# Patient Record
Sex: Female | Born: 1960
Health system: Southern US, Community
[De-identification: ages and names within clinical notes are randomized; demographics above are authoritative.]

## PROBLEM LIST (undated history)

## (undated) DIAGNOSIS — E785 Hyperlipidemia, unspecified: Secondary | ICD-10-CM

## (undated) DIAGNOSIS — Z8781 Personal history of (healed) traumatic fracture: Secondary | ICD-10-CM

## (undated) HISTORY — PX: WRIST SURGERY: SHX841

## (undated) HISTORY — DX: Personal history of (healed) traumatic fracture: Z87.81

## (undated) HISTORY — DX: Hyperlipidemia, unspecified: E78.5

---

## 2000-01-19 ENCOUNTER — Other Ambulatory Visit: Admission: RE | Admit: 2000-01-19 | Discharge: 2000-01-19 | Payer: Self-pay | Admitting: *Deleted

## 2000-01-20 ENCOUNTER — Other Ambulatory Visit: Admission: RE | Admit: 2000-01-20 | Discharge: 2000-01-20 | Payer: Self-pay | Admitting: *Deleted

## 2000-08-05 ENCOUNTER — Emergency Department (HOSPITAL_COMMUNITY): Admission: EM | Admit: 2000-08-05 | Discharge: 2000-08-05 | Payer: Self-pay | Admitting: Emergency Medicine

## 2000-08-13 ENCOUNTER — Ambulatory Visit (HOSPITAL_COMMUNITY): Admission: RE | Admit: 2000-08-13 | Discharge: 2000-08-13 | Payer: Self-pay | Admitting: *Deleted

## 2001-03-29 ENCOUNTER — Other Ambulatory Visit: Admission: RE | Admit: 2001-03-29 | Discharge: 2001-03-29 | Payer: Self-pay | Admitting: *Deleted

## 2002-05-08 ENCOUNTER — Other Ambulatory Visit: Admission: RE | Admit: 2002-05-08 | Discharge: 2002-05-08 | Payer: Self-pay | Admitting: *Deleted

## 2003-06-30 ENCOUNTER — Other Ambulatory Visit: Admission: RE | Admit: 2003-06-30 | Discharge: 2003-06-30 | Payer: Self-pay | Admitting: *Deleted

## 2016-10-16 HISTORY — PX: WRIST SURGERY: SHX841

## 2018-03-28 ENCOUNTER — Encounter: Payer: Self-pay | Admitting: Family Medicine

## 2018-03-28 ENCOUNTER — Ambulatory Visit (INDEPENDENT_AMBULATORY_CARE_PROVIDER_SITE_OTHER): Payer: BLUE CROSS/BLUE SHIELD | Admitting: Family Medicine

## 2018-03-28 VITALS — BP 120/74 | HR 60 | Ht 61.25 in | Wt 163.8 lb

## 2018-03-28 DIAGNOSIS — W57XXXA Bitten or stung by nonvenomous insect and other nonvenomous arthropods, initial encounter: Secondary | ICD-10-CM

## 2018-03-28 DIAGNOSIS — Z7689 Persons encountering health services in other specified circumstances: Secondary | ICD-10-CM | POA: Diagnosis not present

## 2018-03-28 DIAGNOSIS — S30861A Insect bite (nonvenomous) of abdominal wall, initial encounter: Secondary | ICD-10-CM

## 2018-03-28 DIAGNOSIS — S80861A Insect bite (nonvenomous), right lower leg, initial encounter: Secondary | ICD-10-CM | POA: Diagnosis not present

## 2018-03-28 NOTE — Progress Notes (Signed)
Subjective:    Patient ID: Renee Morton, female    DOB: May 17, 1961, 57 y.o.   MRN: [id number]  HPI Chief Complaint  Patient presents with  . New Patient (Initial Visit)  . tick bite    waist line    She is new to the practice and here to establish care. No PCP in years   Reports being bitten by several insects including ticks and mosquitoes over the past month or so. States she saw a tick on right waistline and the tick was not engorged.   States on May 8-she had fever, chills, body aches, flu like symptoms and felt better the next morning. Has felt fine since then. No fever, chills, headache, rash, neck pain, vision changes, chest pain, shortness of breath, abdominal pain, N/V/D.   May 12- felt a scab at her waistline and pulled it off.  She is not sure if it was a tick or not.  States a couple of days later she had what she felt like was a bullseye rash around the area of the bite.   She then had what she believes was a mosquito bite on her right upper inner thigh and now has a scab and bruise around the area.   States she went to [facility] in [place] on May 17th and was prescribed Doxycycline and took a 10-day course of this.  States she also had blood work done and was told that it was negative.  She does not have the results with her.   She brought in what she thinks is a tick that she pulled off for me to see. Looking at it under a magnifying glass reveals what appears to be a piece of fuzz and not an insect.   LMP: 10 years ago.   Work in Airline pilot at [employer].   Overdue on mammogram, pap smear, colonoscopy etc.   Reviewed allergies, medications, past medical, surgical, family, and social history.    Review of Systems Pertinent positives and negatives in the history of present illness.     Objective:   Physical Exam  Constitutional: She is oriented to person, place, and time. She appears well-developed and well-nourished. No distress.  Neck: Normal range  of motion. Neck supple.  Musculoskeletal: Normal range of motion.  Neurological: She is alert and oriented to person, place, and time.  Skin: Skin is warm and dry. Capillary refill takes less than 2 seconds. No rash noted. No pallor.  Small purplish round bruise on her right medial upper leg, no edema, non tender, no sign of infection.   Right lower abdomen with a insect bite that appears to be healing, no sign of infection.   Dry patch of skin with a raised pink center to her left waistline. No induration or fluctuance. No sign of infection.   Psychiatric: She has a normal mood and affect. Her behavior is normal. Thought content normal.   BP 120/74   Pulse 60   Ht 5' 1.25" (1.556 m)   Wt 163 lb 12.8 oz (74.3 kg)   SpO2 98%   BMI 30.70 kg/m       Assessment & Plan:  Tick bite of abdomen, initial encounter  Insect bite of right lower extremity, initial encounter  Encounter to establish care  Discussed that she has no signs or symptoms of a tick borne illness and reassured her that she was also treated with a 10 day course of Doxycycline and had negative labs for tick illness  at the urgent care (per patient).  Encouraged her to let me know if she develops any new or worsening symptoms but for now she is well appearing and no further testing is needed.  Encouraged her to return for a CPE and to get updated on preventive healthcare. She will consider.

## 2019-12-03 ENCOUNTER — Ambulatory Visit (INDEPENDENT_AMBULATORY_CARE_PROVIDER_SITE_OTHER): Payer: Managed Care, Other (non HMO) | Admitting: Family Medicine

## 2019-12-03 ENCOUNTER — Other Ambulatory Visit: Payer: Self-pay

## 2019-12-03 ENCOUNTER — Encounter: Payer: Self-pay | Admitting: Family Medicine

## 2019-12-03 VITALS — BP 118/70 | HR 60 | Temp 96.9°F | Wt 169.8 lb

## 2019-12-03 DIAGNOSIS — Z0000 Encounter for general adult medical examination without abnormal findings: Secondary | ICD-10-CM

## 2019-12-03 DIAGNOSIS — Z1231 Encounter for screening mammogram for malignant neoplasm of breast: Secondary | ICD-10-CM

## 2019-12-03 DIAGNOSIS — Z789 Other specified health status: Secondary | ICD-10-CM | POA: Diagnosis not present

## 2019-12-03 DIAGNOSIS — Z23 Encounter for immunization: Secondary | ICD-10-CM

## 2019-12-03 DIAGNOSIS — E669 Obesity, unspecified: Secondary | ICD-10-CM

## 2019-12-03 DIAGNOSIS — Z8781 Personal history of (healed) traumatic fracture: Secondary | ICD-10-CM | POA: Diagnosis not present

## 2019-12-03 DIAGNOSIS — Z1211 Encounter for screening for malignant neoplasm of colon: Secondary | ICD-10-CM

## 2019-12-03 DIAGNOSIS — E2839 Other primary ovarian failure: Secondary | ICD-10-CM | POA: Diagnosis not present

## 2019-12-03 DIAGNOSIS — Z8639 Personal history of other endocrine, nutritional and metabolic disease: Secondary | ICD-10-CM

## 2019-12-03 DIAGNOSIS — Z1329 Encounter for screening for other suspected endocrine disorder: Secondary | ICD-10-CM

## 2019-12-03 NOTE — Progress Notes (Signed)
Subjective:    Patient ID: Renee Morton, female    DOB: November 22, 1960, 59 y.o.   MRN: [id number]  HPI Chief Complaint  Patient presents with  . other    referral for bone scan , boken bones in the past , Tdap,, mammo, cologuard    She is fairly new to the practice and here for a complete physical exam. Previous medical care: does not get regular medical care  Last CPE: many years ago  Other providers: None  She would like to get a bone density. States she had a wrist fracture as an adult. Her sister was diagnosed with osteoporosis.  She is not currently taking any supplements. Does not drink calcium. Hx of low vitamin D   Complains of a knot in the arch of her left foot for the past several weeks. No pain. No injury.    Social history: Work in Airline pilot at [employer].  Denies smoking or  drug use  States she drinks 1/2 bottle of wine nightly  Diet: nothing in particular  Excerise: none  Immunizations: would like to update Tdap  Health maintenance:  Mammogram:never  Colonoscopy:never  Last Gynecological Exam: many years ago  Last Menstrual cycle: 13 years ago  Reviewed allergies, medications, past medical, surgical, family, and social history.     Review of Systems Pertinent positives and negatives in the history of present illness.     Objective:   Physical Exam BP 118/70 (BP Location: Left Arm, Patient Position: Sitting)   Pulse 60   Temp (!) 96.9 F (36.1 C)   Wt 169 lb 12.8 oz (77 kg)   SpO2 99%   BMI 31.82 kg/m   General Appearance:    Alert, cooperative, no distress, appears stated age  Head:    Normocephalic, without obvious abnormality, atraumatic  Eyes:    PERRL, conjunctiva/corneas clear, EOM's intact  Ears:    Normal TM's and external ear canals  Nose:   Mask in place   Throat:   Mask   Neck:   Supple, no lymphadenopathy;  thyroid:  no   enlargement/tenderness/nodules; no JVD  Back:    Spine nontender, no curvature, ROM normal, no  CVA     tenderness  Lungs:     Clear to auscultation bilaterally without wheezes, rales or     ronchi; respirations unlabored  Chest Wall:    No tenderness or deformity   Heart:    Regular rate and rhythm, S1 and S2 normal, no murmur, rub   or gallop  Breast Exam:    Declines   Abdomen:     Soft, non-tender, nondistended, normoactive bowel sounds,    no masses, no hepatosplenomegaly  Genitalia:    Declines      Extremities:   No clubbing, cyanosis or edema  Pulses:   2+ and symmetric all extremities  Skin:   Skin color, texture, turgor normal, no rashes. Round, firm non tender cyst in arch of left foot   Lymph nodes:   Cervical, supraclavicular, and axillary nodes normal  Neurologic:   CNII-XII intact, normal strength, sensation and gait          Psych:   Normal mood, affect, hygiene and grooming.         Assessment & Plan:  Routine general medical examination at a health care facility - Plan: CBC with Differential/Platelet, Comprehensive metabolic panel, TSH, T4, free, T3, Lipid panel -She presented today requesting bone density study, Tdap and mammogram and is way overdue  for a CPE.  We did turn this into a CPE and discussed preventive healthcare including cancer screenings.  She will call and schedule mammogram and bone density at the breast center.  She refuses colonoscopy so I will order a Cologuard.  She refuses Pap smear.  Immunizations reviewed.  Discussed safety and health promotion.  Discussed healthy lifestyle including diet and exercise  Estrogen deficiency - Plan: DG Bone Density -Follow-up pending results.  Counseling on adequate calcium intake, vitamin D and weightbearing exercise  History of wrist fracture - Plan: DG Bone Density  Daily consumption of alcohol -Encouraged her to reduce her alcohol intake due to long-term health consequences  History of vitamin D deficiency - Plan: VITAMIN D 25 Hydroxy (Vit-D Deficiency, Fractures) -Check vitamin D level and replace as  appropriate  Screen for colon cancer - Plan: Cologuard -She declines colonoscopy.  Cologuard ordered and this will be her first colon cancer screening test  Encounter for screening mammogram for malignant neoplasm of breast - Plan: MM Digital Diagnostic Bilateral -She will call and schedule this at the breast center along with her DEXA  Obesity (BMI 30.0-34.9) - Plan: TSH, T4, free, T3, Lipid panel -Counseling on healthy diet and exercise  Need for diphtheria-tetanus-pertussis (Tdap) vaccine - Plan: Tdap vaccine greater than or equal to 7yo IM -Counseling on all components of the vaccine  Screening for thyroid disorder - Plan: TSH, T4, free, T3

## 2019-12-03 NOTE — Patient Instructions (Signed)
Call and schedule your mammogram and bone density at the breast center. You should receive the Cologuard for colon cancer screening in the mail with instructions on how to return it.  Return for your Pap smear at your convenience.  I recommend cutting back on the amount of wine you are drinking.   Try to eat a well-balanced diet with fresh fruits and vegetables and cut back on fatty foods and carbohydrates such as potatoes, rice, pasta, bread. It is recommended that you get at least 150 minutes of physical activity each week.  We will be in touch with your lab results     Preventive Care 58-78 Years Old, Female Preventive care refers to visits with your health care provider and lifestyle choices that can promote health and wellness. This includes:  A yearly physical exam. This may also be called an annual well check.  Regular dental visits and eye exams.  Immunizations.  Screening for certain conditions.  Healthy lifestyle choices, such as eating a healthy diet, getting regular exercise, not using drugs or products that contain nicotine and tobacco, and limiting alcohol use. What can I expect for my preventive care visit? Physical exam Your health care provider will check your:  Height and weight. This may be used to calculate body mass index (BMI), which tells if you are at a healthy weight.  Heart rate and blood pressure.  Skin for abnormal spots. Counseling Your health care provider may ask you questions about your:  Alcohol, tobacco, and drug use.  Emotional well-being.  Home and relationship well-being.  Sexual activity.  Eating habits.  Work and work Statistician.  Method of birth control.  Menstrual cycle.  Pregnancy history. What immunizations do I need?  Influenza (flu) vaccine  This is recommended every year. Tetanus, diphtheria, and pertussis (Tdap) vaccine  You may need a Td booster every 10 years. Varicella (chickenpox) vaccine  You may  need this if you have not been vaccinated. Zoster (shingles) vaccine  You may need this after age 28. Measles, mumps, and rubella (MMR) vaccine  You may need at least one dose of MMR if you were born in 1957 or later. You may also need a second dose. Pneumococcal conjugate (PCV13) vaccine  You may need this if you have certain conditions and were not previously vaccinated. Pneumococcal polysaccharide (PPSV23) vaccine  You may need one or two doses if you smoke cigarettes or if you have certain conditions. Meningococcal conjugate (MenACWY) vaccine  You may need this if you have certain conditions. Hepatitis A vaccine  You may need this if you have certain conditions or if you travel or work in places where you may be exposed to hepatitis A. Hepatitis B vaccine  You may need this if you have certain conditions or if you travel or work in places where you may be exposed to hepatitis B. Haemophilus influenzae type b (Hib) vaccine  You may need this if you have certain conditions. Human papillomavirus (HPV) vaccine  If recommended by your health care provider, you may need three doses over 6 months. You may receive vaccines as individual doses or as more than one vaccine together in one shot (combination vaccines). Talk with your health care provider about the risks and benefits of combination vaccines. What tests do I need? Blood tests  Lipid and cholesterol levels. These may be checked every 5 years, or more frequently if you are over 51 years old.  Hepatitis C test.  Hepatitis B test. Screening  Lung  cancer screening. You may have this screening every year starting at age 92 if you have a 30-pack-year history of smoking and currently smoke or have quit within the past 15 years.  Colorectal cancer screening. All adults should have this screening starting at age 20 and continuing until age 50. Your health care provider may recommend screening at age 40 if you are at increased  risk. You will have tests every 1-10 years, depending on your results and the type of screening test.  Diabetes screening. This is done by checking your blood sugar (glucose) after you have not eaten for a while (fasting). You may have this done every 1-3 years.  Mammogram. This may be done every 1-2 years. Talk with your health care provider about when you should start having regular mammograms. This may depend on whether you have a family history of breast cancer.  BRCA-related cancer screening. This may be done if you have a family history of breast, ovarian, tubal, or peritoneal cancers.  Pelvic exam and Pap test. This may be done every 3 years starting at age 92. Starting at age 52, this may be done every 5 years if you have a Pap test in combination with an HPV test. Other tests  Sexually transmitted disease (STD) testing.  Bone density scan. This is done to screen for osteoporosis. You may have this scan if you are at high risk for osteoporosis. Follow these instructions at home: Eating and drinking  Eat a diet that includes fresh fruits and vegetables, whole grains, lean protein, and low-fat dairy.  Take vitamin and mineral supplements as recommended by your health care provider.  Do not drink alcohol if: ? Your health care provider tells you not to drink. ? You are pregnant, may be pregnant, or are planning to become pregnant.  If you drink alcohol: ? Limit how much you have to 0-1 drink a day. ? Be aware of how much alcohol is in your drink. In the [place] one drink equals one 12 oz bottle of beer (355 mL), one 5 oz glass of wine (148 mL), or one 1 oz glass of hard liquor (44 mL). Lifestyle  Take daily care of your teeth and gums.  Stay active. Exercise for at least 30 minutes on 5 or more days each week.  Do not use any products that contain nicotine or tobacco, such as cigarettes, e-cigarettes, and chewing tobacco. If you need help quitting, ask your health care provider.   If you are sexually active, practice safe sex. Use a condom or other form of birth control (contraception) in order to prevent pregnancy and STIs (sexually transmitted infections).  If told by your health care provider, take low-dose aspirin daily starting at age 51. What's next?  Visit your health care provider once a year for a well check visit.  Ask your health care provider how often you should have your eyes and teeth checked.  Stay up to date on all vaccines. This information is not intended to replace advice given to you by your health care provider. Make sure you discuss any questions you have with your health care provider. Document Revised: 06/13/2018 Document Reviewed: 06/13/2018 Elsevier Patient Education  2020 [employer].

## 2019-12-04 ENCOUNTER — Other Ambulatory Visit: Payer: Self-pay | Admitting: Family Medicine

## 2019-12-04 ENCOUNTER — Encounter: Payer: Self-pay | Admitting: Family Medicine

## 2019-12-04 DIAGNOSIS — E559 Vitamin D deficiency, unspecified: Secondary | ICD-10-CM

## 2019-12-04 HISTORY — DX: Vitamin D deficiency, unspecified: E55.9

## 2019-12-04 LAB — CBC WITH DIFFERENTIAL/PLATELET
Basophils Absolute: 0 10*3/uL (ref 0.0–0.2)
Basos: 0 %
EOS (ABSOLUTE): 0.1 10*3/uL (ref 0.0–0.4)
Eos: 1 %
Hematocrit: 42.3 % (ref 34.0–46.6)
Hemoglobin: 14.6 g/dL (ref 11.1–15.9)
Immature Grans (Abs): 0 10*3/uL (ref 0.0–0.1)
Immature Granulocytes: 0 %
Lymphocytes Absolute: 2.6 10*3/uL (ref 0.7–3.1)
Lymphs: 40 %
MCH: 31.1 pg (ref 26.6–33.0)
MCHC: 34.5 g/dL (ref 31.5–35.7)
MCV: 90 fL (ref 79–97)
Monocytes Absolute: 0.5 10*3/uL (ref 0.1–0.9)
Monocytes: 8 %
Neutrophils Absolute: 3.2 10*3/uL (ref 1.4–7.0)
Neutrophils: 51 %
Platelets: 305 10*3/uL (ref 150–450)
RBC: 4.7 x10E6/uL (ref 3.77–5.28)
RDW: 13 % (ref 11.7–15.4)
WBC: 6.3 10*3/uL (ref 3.4–10.8)

## 2019-12-04 LAB — COMPREHENSIVE METABOLIC PANEL
ALT: 14 IU/L (ref 0–32)
AST: 17 IU/L (ref 0–40)
Albumin/Globulin Ratio: 1.5 (ref 1.2–2.2)
Albumin: 4.7 g/dL (ref 3.8–4.9)
Alkaline Phosphatase: 108 IU/L (ref 39–117)
BUN/Creatinine Ratio: 24 (ref 12–28)
BUN: 16 mg/dL (ref 8–27)
Bilirubin Total: 0.6 mg/dL (ref 0.0–1.2)
CO2: 24 mmol/L (ref 20–29)
Calcium: 10 mg/dL (ref 8.7–10.3)
Chloride: 104 mmol/L (ref 96–106)
Creatinine, Ser: 0.68 mg/dL (ref 0.57–1.00)
GFR calc Af Amer: 110 mL/min/{1.73_m2} (ref >59)
GFR calc non Af Amer: 95 mL/min/{1.73_m2} (ref >59)
Globulin, Total: 3.2 g/dL (ref 1.5–4.5)
Glucose: 100 mg/dL — ABNORMAL HIGH (ref 65–99)
Potassium: 5.1 mmol/L (ref 3.5–5.2)
Sodium: 141 mmol/L (ref 134–144)
Total Protein: 7.9 g/dL (ref 6.0–8.5)

## 2019-12-04 LAB — LIPID PANEL
Chol/HDL Ratio: 2.3 ratio (ref 0.0–4.4)
Cholesterol, Total: 248 mg/dL — ABNORMAL HIGH (ref 100–199)
HDL: 107 mg/dL (ref >39)
LDL Chol Calc (NIH): 129 mg/dL — ABNORMAL HIGH (ref 0–99)
Triglycerides: 72 mg/dL (ref 0–149)
VLDL Cholesterol Cal: 12 mg/dL (ref 5–40)

## 2019-12-04 LAB — TSH: TSH: 1.75 u[IU]/mL (ref 0.450–4.500)

## 2019-12-04 LAB — T3: T3, Total: 96 ng/dL (ref 71–180)

## 2019-12-04 LAB — VITAMIN D 25 HYDROXY (VIT D DEFICIENCY, FRACTURES): Vit D, 25-Hydroxy: 18.9 ng/mL — ABNORMAL LOW (ref 30.0–100.0)

## 2019-12-04 LAB — T4, FREE: Free T4: 1.25 ng/dL (ref 0.82–1.77)

## 2019-12-04 MED ORDER — VITAMIN D (ERGOCALCIFEROL) 1.25 MG (50000 UNIT) PO CAPS: 50000.0000 [IU] | ORAL_CAPSULE | ORAL | 0 refills | Status: DC

## 2019-12-04 NOTE — Progress Notes (Signed)
Her labs are fine overall but vitamin D is low. I will send in a prescription for her to take once weekly for the next 3 months. We will need to recheck her vitamin D at that point and hopefully she can start on a once daily multi-vitamin with 1,000 to 2,000 IUs of vitamin D3 at that point.  Her LDL or bad cholesterol is elevated so cutting back on fried foods, fatty foods and increasing physical activity will help.

## 2019-12-09 ENCOUNTER — Other Ambulatory Visit: Payer: Self-pay

## 2019-12-09 ENCOUNTER — Ambulatory Visit
Admission: RE | Admit: 2019-12-09 | Discharge: 2019-12-09 | Disposition: A | Discharge status: Home / Self Care | Payer: Managed Care, Other (non HMO) | Source: Ambulatory Visit | Attending: Family Medicine | Admitting: Family Medicine

## 2019-12-09 ENCOUNTER — Encounter: Payer: Self-pay | Admitting: Family Medicine

## 2019-12-09 DIAGNOSIS — Z8781 Personal history of (healed) traumatic fracture: Secondary | ICD-10-CM

## 2019-12-09 DIAGNOSIS — E2839 Other primary ovarian failure: Secondary | ICD-10-CM

## 2019-12-09 DIAGNOSIS — M810 Age-related osteoporosis without current pathological fracture: Secondary | ICD-10-CM

## 2019-12-09 DIAGNOSIS — M8580 Other specified disorders of bone density and structure, unspecified site: Secondary | ICD-10-CM | POA: Insufficient documentation

## 2019-12-09 HISTORY — DX: Age-related osteoporosis without current pathological fracture: M81.0

## 2019-12-09 NOTE — Progress Notes (Signed)
Her bone density shows that she has osteoporosis. Please schedule her for a virtual or in office to discuss the diagnosis and treatment options.

## 2019-12-10 ENCOUNTER — Other Ambulatory Visit: Payer: Self-pay | Admitting: Internal Medicine

## 2019-12-10 ENCOUNTER — Encounter: Payer: Self-pay | Admitting: Family Medicine

## 2019-12-10 DIAGNOSIS — R937 Abnormal findings on diagnostic imaging of other parts of musculoskeletal system: Secondary | ICD-10-CM

## 2019-12-10 HISTORY — DX: Abnormal findings on diagnostic imaging of other parts of musculoskeletal system: R93.7

## 2019-12-10 NOTE — Progress Notes (Signed)
Per this addendum from radiology, she needs an XR of her right femur. Diagnosis is in the problem list which is abnormal bone density screening

## 2019-12-12 NOTE — Progress Notes (Signed)
Please call her and let her know that I strongly recommend that she get the XR of her right upper leg. Ask if she is planning on getting this?

## 2019-12-16 ENCOUNTER — Ambulatory Visit
Admission: RE | Admit: 2019-12-16 | Discharge: 2019-12-16 | Disposition: A | Discharge status: Home / Self Care | Payer: Managed Care, Other (non HMO) | Source: Ambulatory Visit | Attending: Family Medicine | Admitting: Family Medicine

## 2019-12-16 ENCOUNTER — Other Ambulatory Visit: Payer: Self-pay

## 2019-12-16 DIAGNOSIS — R937 Abnormal findings on diagnostic imaging of other parts of musculoskeletal system: Secondary | ICD-10-CM

## 2019-12-17 ENCOUNTER — Other Ambulatory Visit: Payer: Self-pay | Admitting: Family Medicine

## 2019-12-17 DIAGNOSIS — M810 Age-related osteoporosis without current pathological fracture: Secondary | ICD-10-CM

## 2019-12-17 DIAGNOSIS — M899 Disorder of bone, unspecified: Secondary | ICD-10-CM

## 2019-12-17 DIAGNOSIS — R937 Abnormal findings on diagnostic imaging of other parts of musculoskeletal system: Secondary | ICD-10-CM

## 2019-12-17 DIAGNOSIS — R936 Abnormal findings on diagnostic imaging of limbs: Secondary | ICD-10-CM

## 2019-12-17 NOTE — Progress Notes (Signed)
She needs an MRI of her right femur due to sclerotic lesion on X-ray. I called Renee Morton and told her the plan. Please let her know as soon as we have the MRI scheduled. Thanks.

## 2019-12-19 ENCOUNTER — Telehealth: Payer: Self-pay | Admitting: Internal Medicine

## 2019-12-19 LAB — COLOGUARD: Cologuard: NEGATIVE

## 2019-12-19 NOTE — Telephone Encounter (Signed)
Pt states that she got a bill for her bone density and it was not coded as preventative and I advised her that he it was coded as family hx, history of wrist fracture and estrogen def. She wants it to be recoded as preventative as that's why she wanted to get the bone density.

## 2019-12-21 NOTE — Telephone Encounter (Signed)
I will check into this Monday and get back to her.

## 2019-12-22 ENCOUNTER — Other Ambulatory Visit: Payer: Self-pay | Admitting: Internal Medicine

## 2019-12-22 DIAGNOSIS — M899 Disorder of bone, unspecified: Secondary | ICD-10-CM

## 2019-12-22 DIAGNOSIS — R936 Abnormal findings on diagnostic imaging of limbs: Secondary | ICD-10-CM

## 2019-12-22 NOTE — Telephone Encounter (Signed)
noted 

## 2019-12-22 NOTE — Progress Notes (Signed)
If she cannot get the MRI, another option would be to offer a referral to orthopedist for a second opinion but I am not sure they would recommend anything new. We can offer this option of course.

## 2019-12-23 LAB — COLOGUARD: COLOGUARD: NEGATIVE

## 2019-12-24 ENCOUNTER — Other Ambulatory Visit: Payer: Self-pay

## 2019-12-24 ENCOUNTER — Encounter: Payer: Self-pay | Admitting: Internal Medicine

## 2019-12-24 ENCOUNTER — Ambulatory Visit (INDEPENDENT_AMBULATORY_CARE_PROVIDER_SITE_OTHER): Payer: Managed Care, Other (non HMO) | Admitting: Orthopaedic Surgery

## 2019-12-24 ENCOUNTER — Encounter: Payer: Self-pay | Admitting: Orthopaedic Surgery

## 2019-12-24 DIAGNOSIS — M899 Disorder of bone, unspecified: Secondary | ICD-10-CM | POA: Diagnosis not present

## 2019-12-24 NOTE — Progress Notes (Signed)
Office Visit Note   Patient: Renee Morton           Date of Birth: 29-Jun-1961           MRN: [id number] Visit Date: 12/24/2019              Requested by: Girtha Rm, NP-C [place],  [place] [address] PCP: Girtha Rm, NP-C   Assessment & Plan: Visit Diagnoses:  1. Lesion of right femur     Plan: Based on the x-ray findings and the clinical exam findings I would hold off on a MRI for now.  The patient is completely asymptomatic.  I think this can be followed conservative with repeat x-rays of her right femur in 3 months from now.  Not have talked to her in length and if she does develop any night pain or worsening pain or pain with mobility she needs to let us know because we would certainly then recommend an MRI but I do feel that it is warranted to treat this is likely an incidental finding with something benign.  All question concerns were answered addressed.  Follow-Up Instructions: Return in about 3 months (around 03/25/2020).   Orders:  No orders of the defined types were placed in this encounter.  No orders of the defined types were placed in this encounter.     Procedures: No procedures performed   Clinical Data: No additional findings.   Subjective: No chief complaint on file. The patient is a very pleasant 59 year old female is seen here for second opinion as a relates to an incidental finding of questionable bony lesions in her right femur.  She has never injured her right lower extremity.  She has never had a diagnosis of any type of cancer.  She denies any pain at all with the right femur at the hip or the knee.  She has had bone density exams there is -2 suggesting osteoporosis.  Plain films were obtained of her femur which showed questionable lesions in the femur and so she is here for evaluation and treatment.  An MRI has been ordered but she want to hold off on that until she sees me.  Again she denies any pain at all.  She denies  any pain in other joints or bones either.  She is not a smoker not a diabetic.  She does drink wine on occasion.  HPI  Review of Systems She currently denies any headache, chest pain, shortness of breath, fever, chills, nausea, vomiting  Objective: Vital Signs: There were no vitals taken for this visit.  Physical Exam She is alert and orient x3 and in no acute distress Ortho Exam Examination of her right lower extremity is entirely normal.  She has full range of motion of her right hip and her right knee.  There is no joint effusion of the right knee.  I can compress and palpate throughout the entire femur and she is asymptomatic. Specialty Comments:  No specialty comments available.  Imaging: No results found. I did independently review x-rays of the right femur.  I can see the calcifications in the metaphyseal section of the distal femur as well as at the proximal femur in the metaphysis diaphysis area.  This appears benign to me.  There is no evidence of cortical destruction or lytic lesions.  There are no calcifications in the soft tissues.  There is no evidence of joint effusion at the knee.  PMFS History: Patient Active Problem List  Diagnosis Date Noted  . Abnormal bone density screening 12/10/2019  . Osteoporosis 12/09/2019  . Vitamin D deficiency 12/04/2019  . Daily consumption of alcohol 12/03/2019  . Estrogen deficiency 12/03/2019  . History of wrist fracture    Past Medical History:  Diagnosis Date  . Abnormal bone density screening 12/10/2019  . History of wrist fracture   . Osteoporosis 12/09/2019  . Vitamin D deficiency 12/04/2019    Family History  Problem Relation Age of Onset  . Hypertension Mother   . Prostate cancer Father     Past Surgical History:  Procedure Laterality Date  . WRIST SURGERY     Social History   Occupational History  . Not on file  Tobacco Use  . Smoking status: Never Smoker  . Smokeless tobacco: Never Used  Substance and  Sexual Activity  . Alcohol use: Yes  . Drug use: Never  . Sexual activity: Not Currently

## 2019-12-29 ENCOUNTER — Ambulatory Visit (HOSPITAL_COMMUNITY): Payer: Managed Care, Other (non HMO)

## 2020-01-02 ENCOUNTER — Other Ambulatory Visit: Payer: Self-pay

## 2020-01-02 ENCOUNTER — Ambulatory Visit
Admission: RE | Admit: 2020-01-02 | Discharge: 2020-01-02 | Disposition: A | Discharge status: Home / Self Care | Payer: Managed Care, Other (non HMO) | Source: Ambulatory Visit | Attending: Family Medicine | Admitting: Family Medicine

## 2020-01-02 DIAGNOSIS — Z1231 Encounter for screening mammogram for malignant neoplasm of breast: Secondary | ICD-10-CM

## 2020-03-25 ENCOUNTER — Ambulatory Visit: Payer: Managed Care, Other (non HMO) | Admitting: Orthopaedic Surgery

## 2020-03-30 ENCOUNTER — Telehealth: Payer: Self-pay

## 2020-03-30 NOTE — Telephone Encounter (Signed)
LVM for pt to call back and schedule a nurse visit for a vitamin D recheck. KH

## 2020-07-27 IMAGING — MG DIGITAL SCREENING BILAT W/ TOMO W/ CAD
6 of 10 series · 6 of 30 positions shown · non-contrast
Comparison: None.

ACR Breast Density Category a: The breast tissue is almost entirely
fatty.

CLINICAL DATA: Screening.

EXAM:
DIGITAL SCREENING BILATERAL MAMMOGRAM WITH TOMO AND CAD

[L CC synth-2D]
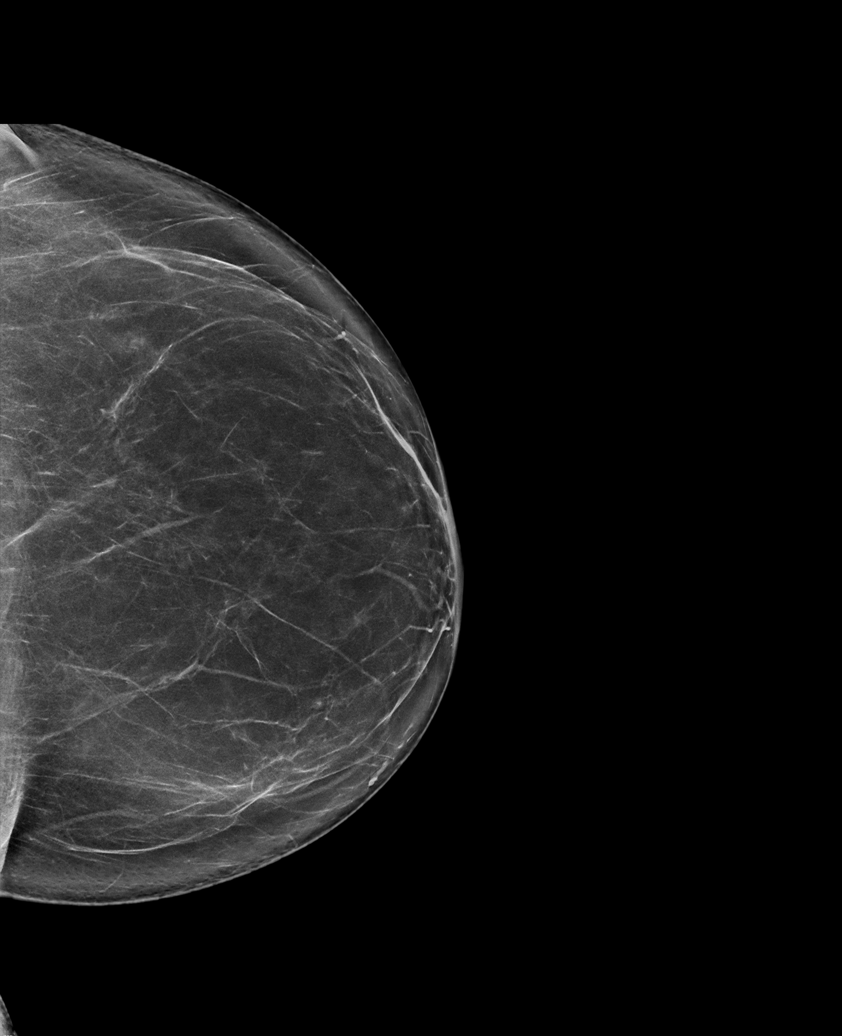

[L MLO synth-2D (1 of 2)]
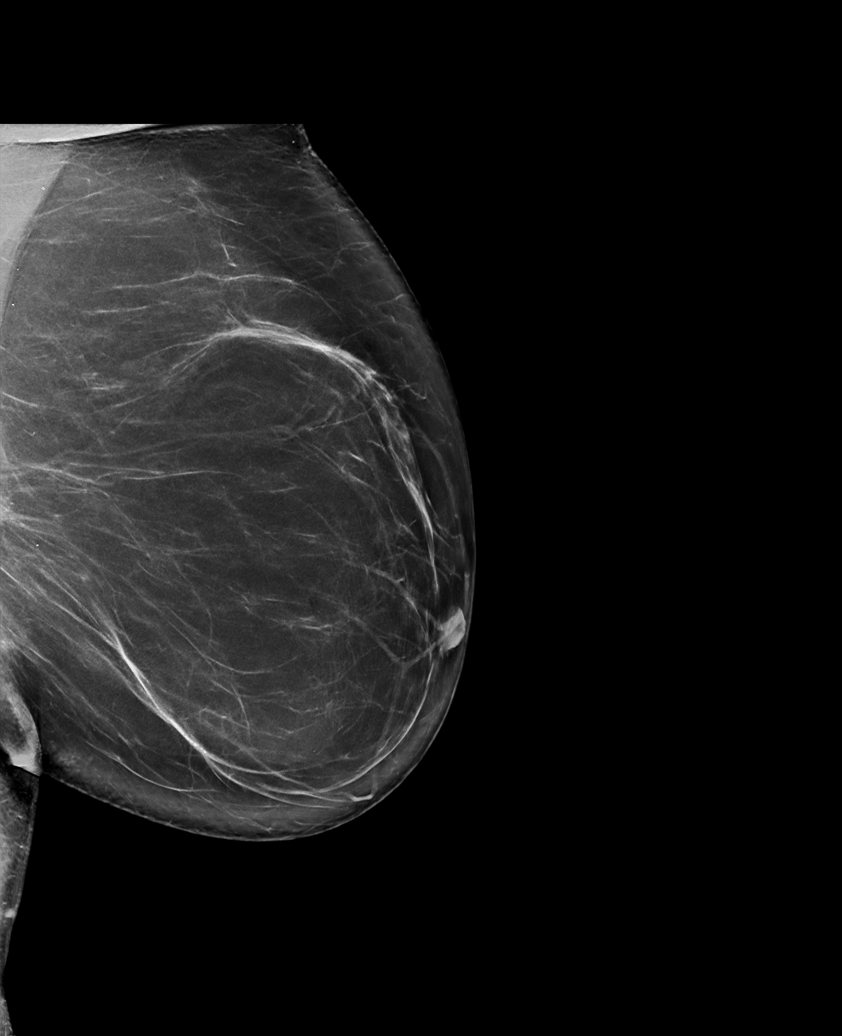

[R MLO synth-2D]
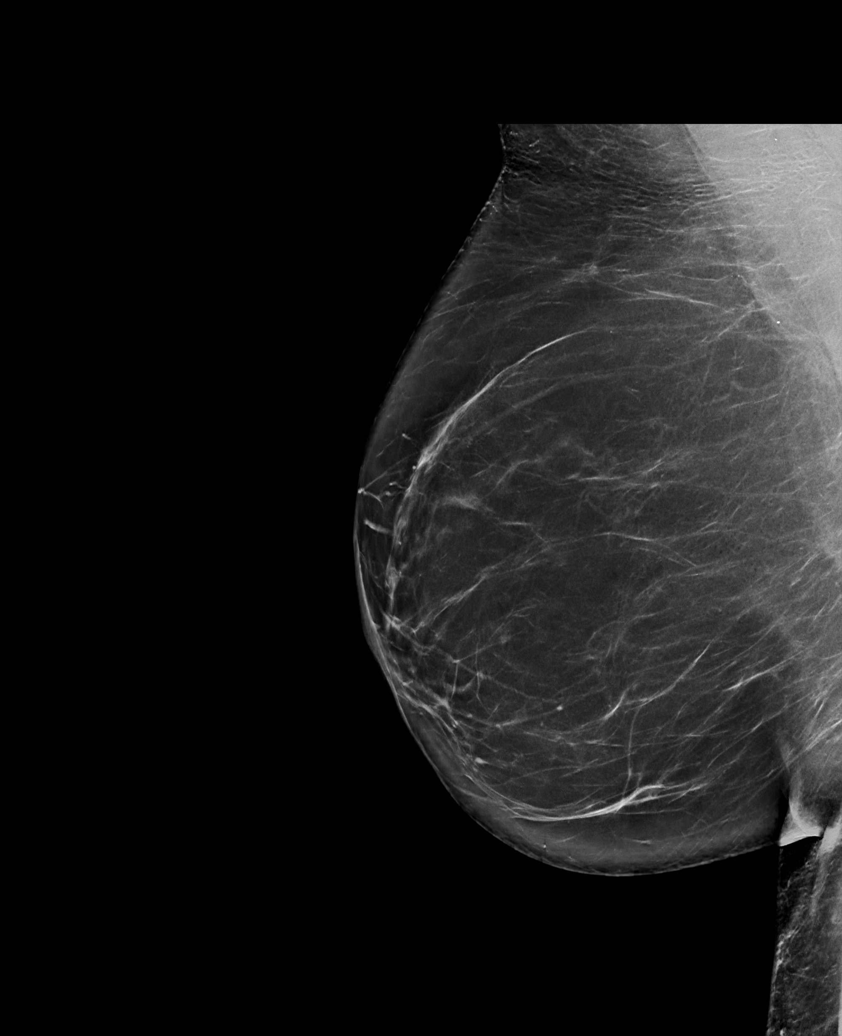

[R CC synth-2D]
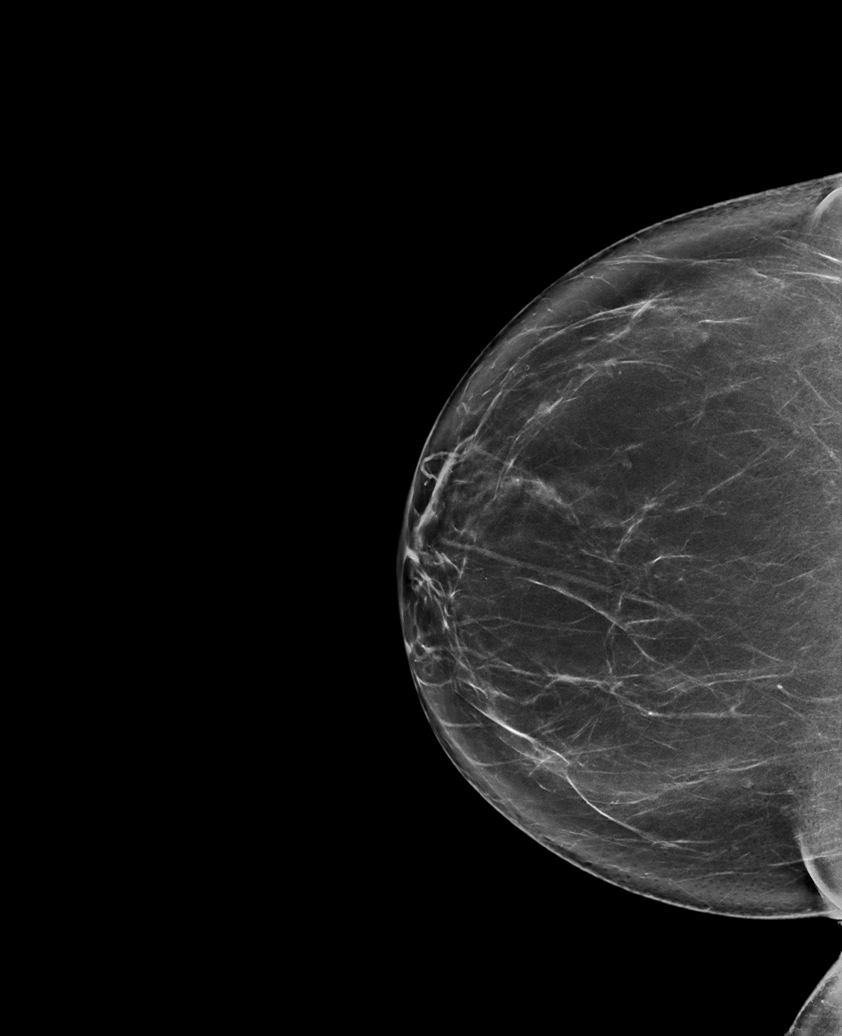

[L MLO synth-2D (2 of 2)]
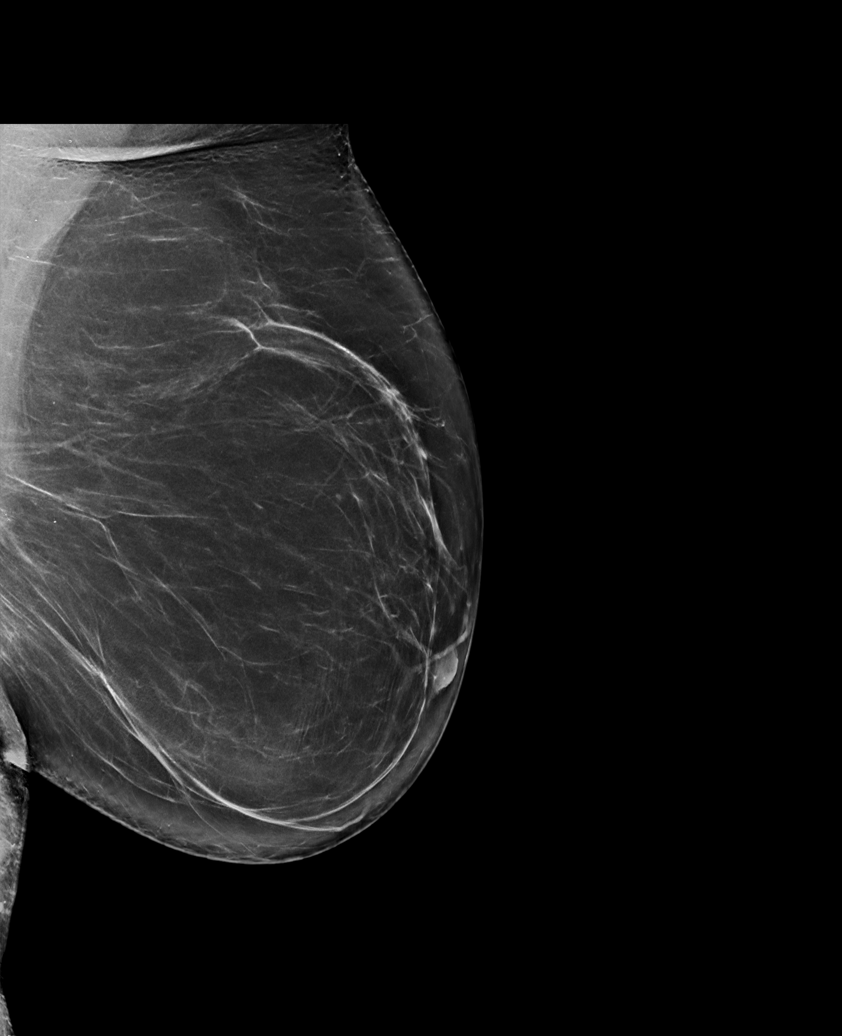

[L CC tomo · tomo slice 40/79.0]
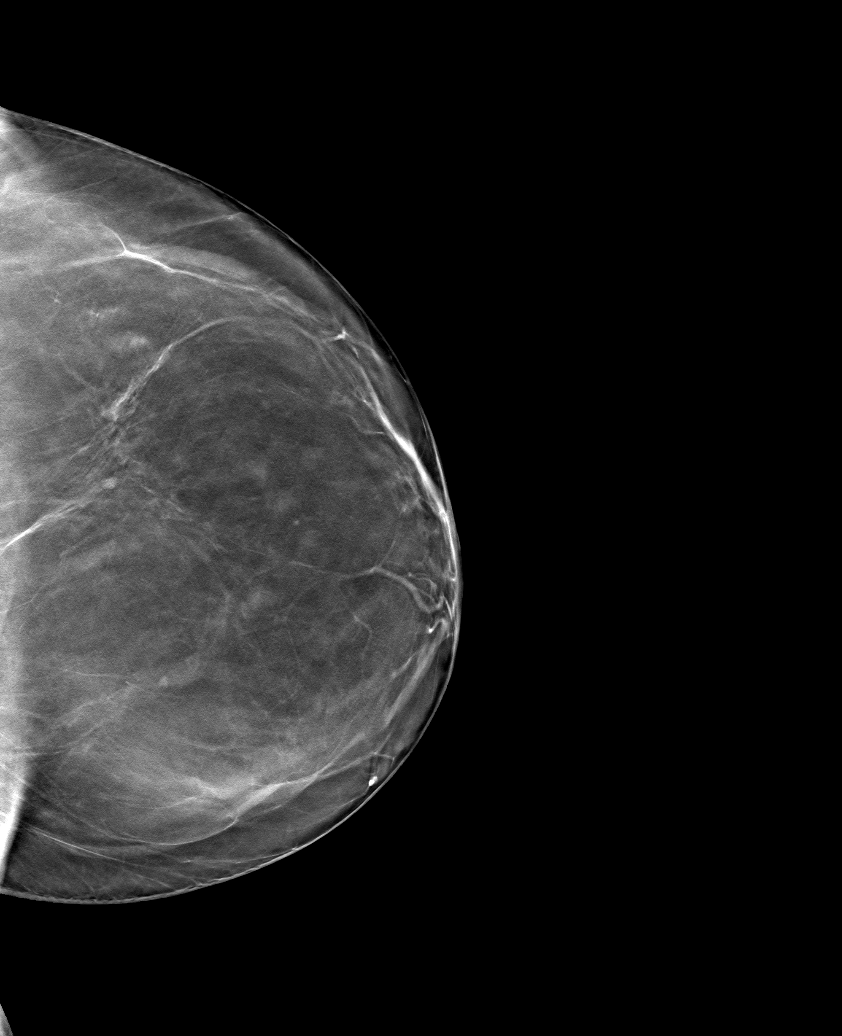

[6 of 30 positions shown; findings below may reference images not displayed]

FINDINGS: There are no findings suspicious for malignancy. Images were
processed with CAD.
IMPRESSION: No mammographic evidence of malignancy. A result letter of this
screening mammogram will be mailed directly to the patient.

RECOMMENDATION:
Screening mammogram in one year. (Code:[id number])

BI-RADS CATEGORY  1: Negative.

## 2021-01-25 ENCOUNTER — Encounter: Payer: Self-pay | Admitting: Internal Medicine

## 2022-08-30 ENCOUNTER — Encounter: Payer: Self-pay | Admitting: Internal Medicine

## 2024-04-21 ENCOUNTER — Ambulatory Visit: Payer: Self-pay

## 2024-04-21 NOTE — Telephone Encounter (Signed)
 FYI Only or Action Required?: FYI only for provider.  Patient was last seen in primary care on unknown. Called Nurse Triage reporting Knee Pain. Symptoms began several months ago. Interventions attempted: Nothing. Symptoms are: stable.  Triage Disposition: See PCP Within 2 Weeks  Patient/caregiver understands and will follow disposition?: Yes                             Copied from CRM [id number]. Topic: Clinical - Red Word Triage >> Apr 21, 2024 11:55 AM Renee Morton wrote: Red Word that prompted transfer to Nurse Triage: The patient called to establish care at [facility] with NP Renee Morton. However, she advised me that she has a lesion on her knee that is causing her pain and sensitivity when kneeling. Reason for Disposition  [1] MILD pain (e.g., does not interfere with normal activities) AND [2] present > 7 days  Answer Assessment - Initial Assessment Questions 1. LOCATION and RADIATION: Where is the pain located?      Right knee, pain is localized to right knee 3. SEVERITY: How bad is the pain? What does it keep you from doing?   (Scale 1-10; or mild, moderate, severe)   -  MILD (1-3): doesn't interfere with normal activities    -  MODERATE (4-7): interferes with normal activities (e.g., work or school) or awakens from sleep, limping    -  SEVERE (8-10): excruciating pain, unable to do any normal activities, unable to walk     Rates pain a 3, states she is able to walk normally  4. ONSET: When did the pain start? Does it come and go, or is it there all the time?     A few months 5. RECURRENT: Have you had this pain before? If Yes, ask: When, and what happened then?     Denies 7. AGGRAVATING FACTORS: What makes the knee pain worse? (e.g., walking, climbing stairs, running)     Kneeling or crawling 8. ASSOCIATED SYMPTOMS: Is there any swelling or redness of the knee?     Denies redness, denies swelling    States she had a  bone scan in 2021 and followed-up with orthopedics  Protocols used: Knee Pain-A-AH

## 2024-04-22 ENCOUNTER — Ambulatory Visit (HOSPITAL_BASED_OUTPATIENT_CLINIC_OR_DEPARTMENT_OTHER)
Admission: RE | Admit: 2024-04-22 | Discharge: 2024-04-22 | Disposition: A | Discharge status: Home / Self Care | Source: Ambulatory Visit | Attending: Family | Admitting: Family

## 2024-04-22 ENCOUNTER — Ambulatory Visit: Payer: Self-pay | Admitting: Family

## 2024-04-22 VITALS — BP 120/80 | HR 74 | Temp 98.1°F | Ht 62.0 in | Wt 166.0 lb

## 2024-04-22 DIAGNOSIS — M810 Age-related osteoporosis without current pathological fracture: Secondary | ICD-10-CM

## 2024-04-22 DIAGNOSIS — M25561 Pain in right knee: Secondary | ICD-10-CM | POA: Insufficient documentation

## 2024-04-22 DIAGNOSIS — R936 Abnormal findings on diagnostic imaging of limbs: Secondary | ICD-10-CM | POA: Insufficient documentation

## 2024-04-22 DIAGNOSIS — Z8349 Family history of other endocrine, nutritional and metabolic diseases: Secondary | ICD-10-CM | POA: Diagnosis not present

## 2024-04-22 DIAGNOSIS — Z1231 Encounter for screening mammogram for malignant neoplasm of breast: Secondary | ICD-10-CM | POA: Diagnosis not present

## 2024-04-22 DIAGNOSIS — L989 Disorder of the skin and subcutaneous tissue, unspecified: Secondary | ICD-10-CM

## 2024-04-22 DIAGNOSIS — Z1211 Encounter for screening for malignant neoplasm of colon: Secondary | ICD-10-CM

## 2024-04-22 DIAGNOSIS — E559 Vitamin D deficiency, unspecified: Secondary | ICD-10-CM

## 2024-04-22 DIAGNOSIS — Z789 Other specified health status: Secondary | ICD-10-CM

## 2024-04-22 DIAGNOSIS — E785 Hyperlipidemia, unspecified: Secondary | ICD-10-CM | POA: Diagnosis not present

## 2024-04-22 DIAGNOSIS — M898X8 Other specified disorders of bone, other site: Secondary | ICD-10-CM | POA: Diagnosis not present

## 2024-04-22 NOTE — Assessment & Plan Note (Signed)
 Update x-ray. If progressive changes, plan referral back to ortho.

## 2024-04-22 NOTE — Assessment & Plan Note (Signed)
 New.  Suspect osteoarthritis. Recommended tylenol or voltaren gel prn.  Obtain baseline x-ray.

## 2024-04-22 NOTE — Assessment & Plan Note (Signed)
 Not currently on supplement. Will update vit D level today.

## 2024-04-22 NOTE — Assessment & Plan Note (Signed)
 Will update dexa and vit D.  Encouraged pt to ensure 1200 mg of calcium daily either through diet or supplement as well as weight bearing exercise.

## 2024-04-22 NOTE — Patient Instructions (Signed)
 VISIT SUMMARY:  Today, you visited us  for your right knee pain and follow-up imaging of your femur. We discussed your history of osteoporosis and other health concerns, including a chronic lip lesion and general health maintenance.  YOUR PLAN:  KNEE PAIN: You have chronic right knee pain, likely due to arthritis. -We will order an x-ray of your right knee to confirm the diagnosis and rule out other causes. -If the x-ray findings are concerning, we may consider an MRI. -We discussed potential causes such as arthritis or bursitis.  FEMUR LESIONS: You have incidental lesions on your femur that need follow-up imaging to assess any changes. -We will order an x-ray of your femur and compare it with the previous x-ray from 2021. -If there are any changes, we may refer you to Dr. Vernetta.  OSTEOPOROSIS: You have bone thinning with a family history and previous fractures, suggesting osteoporosis. -We will order a bone density scan. -We will check your vitamin D  levels. -Consider taking calcium supplements if your dietary intake is < 1200 mg a day. -We will evaluate the need for bone-building medications based on your updated bone density results.  LIP LESION: You have a chronic chapped area on your lip that may be related to sun exposure. -We will refer you to Dr. Junior for a dermatological evaluation.  GENERAL HEALTH MAINTENANCE: Routine screenings and preventive measures are important for your overall health. -We will order a Cologuard test for colon cancer screening. -We will order a mammogram. -We will plan a Pap smear during your upcoming physical. -We will order a metabolic panel including glucose, kidney, and liver function tests. -We will check your cholesterol levels. -We will order a thyroid function test.
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
## 2024-04-22 NOTE — Assessment & Plan Note (Signed)
 Encouraged her to limit alcohol to 1 or less drinks/day.

## 2024-04-23 ENCOUNTER — Encounter: Payer: Self-pay | Admitting: Family

## 2024-04-23 ENCOUNTER — Ambulatory Visit: Payer: Self-pay | Admitting: Family

## 2024-04-23 ENCOUNTER — Telehealth: Payer: Self-pay | Admitting: Family

## 2024-04-23 DIAGNOSIS — E785 Hyperlipidemia, unspecified: Secondary | ICD-10-CM | POA: Insufficient documentation

## 2024-04-23 DIAGNOSIS — M25561 Pain in right knee: Secondary | ICD-10-CM

## 2024-04-23 DIAGNOSIS — E559 Vitamin D deficiency, unspecified: Secondary | ICD-10-CM

## 2024-04-23 LAB — COMPREHENSIVE METABOLIC PANEL WITH GFR
ALT: 14 U/L (ref 0–35)
AST: 19 U/L (ref 0–37)
Albumin: 4.4 g/dL (ref 3.5–5.2)
Alkaline Phosphatase: 75 U/L (ref 39–117)
BUN: 17 mg/dL (ref 6–23)
CO2: 29 meq/L (ref 19–32)
Calcium: 9.7 mg/dL (ref 8.4–10.5)
Chloride: 102 meq/L (ref 96–112)
Creatinine, Ser: 0.79 mg/dL (ref 0.40–1.20)
GFR: 78.91 mL/min (ref >60.00)
Glucose, Bld: 90 mg/dL (ref 70–99)
Potassium: 4.1 meq/L (ref 3.5–5.1)
Sodium: 140 meq/L (ref 135–145)
Total Bilirubin: 0.4 mg/dL (ref 0.2–1.2)
Total Protein: 7.4 g/dL (ref 6.0–8.3)

## 2024-04-23 LAB — LIPID PANEL
Cholesterol: 247 mg/dL — ABNORMAL HIGH (ref 0–200)
HDL: 72 mg/dL (ref >39.00)
LDL Cholesterol: 145 mg/dL — ABNORMAL HIGH (ref 0–99)
NonHDL: 174.63
Total CHOL/HDL Ratio: 3
Triglycerides: 150 mg/dL — ABNORMAL HIGH (ref 0.0–149.0)
VLDL: 30 mg/dL (ref 0.0–40.0)

## 2024-04-23 LAB — TSH: TSH: 1.24 u[IU]/mL (ref 0.35–5.50)

## 2024-04-23 LAB — VITAMIN D 25 HYDROXY (VIT D DEFICIENCY, FRACTURES): VITD: 24.82 ng/mL — ABNORMAL LOW (ref 30.00–100.00)

## 2024-04-23 MED ORDER — VITAMIN D (ERGOCALCIFEROL) 1.25 MG (50000 UNIT) PO CAPS: 50000.0000 [IU] | ORAL_CAPSULE | ORAL | 0 refills | Status: DC

## 2024-04-23 NOTE — Telephone Encounter (Signed)
 Vitamin D  level is low.  Advise patient to begin vit D 50000 units once weekly for 12 weeks, then plan follow up in 12 weeks for CPE and vit D follow up.  Cholesterol is elevated. Please continue work on low fat/low cholesterol diet/exercise and weight loss.

## 2024-04-24 NOTE — Telephone Encounter (Signed)
 Pt aware and appt scheduled for 08/2024

## 2024-04-24 NOTE — Telephone Encounter (Signed)
 LMOM asking for call back. Okay for E2C2 to discuss and schedule cpx in 12 weeks.

## 2024-04-27 DIAGNOSIS — Z1211 Encounter for screening for malignant neoplasm of colon: Secondary | ICD-10-CM | POA: Diagnosis not present

## 2024-04-28 NOTE — Addendum Note (Signed)
 Addended by: DARYL SETTER on: 04/28/2024 08:24 PM   Modules accepted: Orders

## 2024-05-04 LAB — COLOGUARD: COLOGUARD: NEGATIVE

## 2024-05-14 ENCOUNTER — Other Ambulatory Visit: Payer: Self-pay

## 2024-05-14 ENCOUNTER — Ambulatory Visit: Admitting: Orthopaedic Surgery

## 2024-05-14 DIAGNOSIS — G8929 Other chronic pain: Secondary | ICD-10-CM

## 2024-05-14 DIAGNOSIS — M25561 Pain in right knee: Secondary | ICD-10-CM

## 2024-05-14 NOTE — Progress Notes (Signed)
 The patient is a very pleasant and active 63 year old female who comes in today to go over x-rays of her right femur which include her hip and her knee.  She has remote history of benign lesions or cortical irregularities in the metaphyseal section of the femur toward the knee and up toward the hip area.  These look to be more like benign cartilage based lesions versus just bone infarcts.  We saw these back in 2021.  She only reports a little bit of knee pain but she points to the patella tendon area of the source of her knee pain on the right knee when she kneels down on her knee.  She also reports that her ankles have been stiff after she does a lot of yard work and may have little bit decreased motion.  She denies any specific injuries.  She does not have pain that wakes her up at night.  Her right hip and right knee moves smoothly fluid and no effusion.  There is no pain at the thigh at the distal femur or the proximal femur area.  Both her ankles show intact Achilles and ligamentous exams with not a lot of stiffness today at all.  We did talk about shoe wear.  I did look at the x-rays of her femur and these are unchanged from x-rays done in 2021.  The cortex of the bone is great and the joint spaces are well-maintained of the hip and the knee.  The lesions in the bone are definitely benign and unchanged which gave her reassurance.  We did talk about shoewear in terms of firmer shoes when she is doing a lot of outdoor activities and avoiding open back shoes.  I did give her reassurance about her knee as well as the films.  If she does have any worsening problems she knows to reach out to us .

## 2024-05-26 ENCOUNTER — Ambulatory Visit (HOSPITAL_BASED_OUTPATIENT_CLINIC_OR_DEPARTMENT_OTHER)
Admission: RE | Admit: 2024-05-26 | Discharge: 2024-05-26 | Disposition: A | Discharge status: Home / Self Care | Source: Ambulatory Visit | Attending: Family | Admitting: Family

## 2024-05-26 ENCOUNTER — Encounter (HOSPITAL_BASED_OUTPATIENT_CLINIC_OR_DEPARTMENT_OTHER): Payer: Self-pay

## 2024-05-26 DIAGNOSIS — M810 Age-related osteoporosis without current pathological fracture: Secondary | ICD-10-CM | POA: Insufficient documentation

## 2024-05-26 DIAGNOSIS — Z1231 Encounter for screening mammogram for malignant neoplasm of breast: Secondary | ICD-10-CM | POA: Insufficient documentation

## 2024-05-26 DIAGNOSIS — Z780 Asymptomatic menopausal state: Secondary | ICD-10-CM | POA: Diagnosis not present

## 2024-05-26 DIAGNOSIS — M8589 Other specified disorders of bone density and structure, multiple sites: Secondary | ICD-10-CM | POA: Diagnosis not present

## 2024-05-27 ENCOUNTER — Encounter: Payer: Self-pay | Admitting: Family

## 2024-07-14 ENCOUNTER — Other Ambulatory Visit: Payer: Self-pay | Admitting: Family

## 2024-07-14 NOTE — Telephone Encounter (Signed)
 Pt needs follow up vit D level before we can refill Vit D.

## 2024-07-16 ENCOUNTER — Other Ambulatory Visit (INDEPENDENT_AMBULATORY_CARE_PROVIDER_SITE_OTHER)

## 2024-07-16 DIAGNOSIS — E559 Vitamin D deficiency, unspecified: Secondary | ICD-10-CM

## 2024-07-16 NOTE — Telephone Encounter (Signed)
 Patient notified and scheduled to come in today for labs

## 2024-07-17 ENCOUNTER — Other Ambulatory Visit: Payer: Self-pay | Admitting: Family

## 2024-07-17 ENCOUNTER — Ambulatory Visit: Payer: Self-pay | Admitting: Family

## 2024-07-17 DIAGNOSIS — E559 Vitamin D deficiency, unspecified: Secondary | ICD-10-CM

## 2024-07-17 LAB — VITAMIN D 25 HYDROXY (VIT D DEFICIENCY, FRACTURES): VITD: 42.04 ng/mL (ref 30.00–100.00)

## 2024-07-17 MED ORDER — VITAMIN D3 75 MCG (3000 UT) PO TABS: 1.0000 | ORAL_TABLET | Freq: Every day | ORAL | Status: AC

## 2024-08-18 ENCOUNTER — Encounter: Payer: Self-pay | Admitting: Radiology

## 2024-09-02 ENCOUNTER — Other Ambulatory Visit (HOSPITAL_COMMUNITY)
Admission: RE | Admit: 2024-09-02 | Discharge: 2024-09-02 | Disposition: A | Discharge status: Home / Self Care | Source: Ambulatory Visit | Attending: Family | Admitting: Family

## 2024-09-02 ENCOUNTER — Encounter: Payer: Self-pay | Admitting: Family

## 2024-09-02 ENCOUNTER — Ambulatory Visit: Admitting: Family

## 2024-09-02 VITALS — BP 124/53 | HR 71 | Temp 97.5°F | Resp 16 | Ht 62.0 in | Wt 166.0 lb

## 2024-09-02 DIAGNOSIS — N841 Polyp of cervix uteri: Secondary | ICD-10-CM | POA: Insufficient documentation

## 2024-09-02 DIAGNOSIS — Z0000 Encounter for general adult medical examination without abnormal findings: Secondary | ICD-10-CM | POA: Diagnosis not present

## 2024-09-02 DIAGNOSIS — M8580 Other specified disorders of bone density and structure, unspecified site: Secondary | ICD-10-CM | POA: Diagnosis not present

## 2024-09-02 DIAGNOSIS — Z0001 Encounter for general adult medical examination with abnormal findings: Secondary | ICD-10-CM | POA: Insufficient documentation

## 2024-09-02 DIAGNOSIS — Z01419 Encounter for gynecological examination (general) (routine) without abnormal findings: Secondary | ICD-10-CM | POA: Insufficient documentation

## 2024-09-02 NOTE — Assessment & Plan Note (Signed)
New. Refer to GYN.  

## 2024-09-02 NOTE — Progress Notes (Signed)
 Subjective:     Patient ID: Renee Morton, female    DOB: [birth year], 63 y.o.   MRN: [id number]  Chief Complaint  Patient presents with   Annual Exam    HPI  Discussed the use of AI scribe software for clinical note transcription with the patient, who gave verbal consent to proceed.  History of Present Illness Renee Morton is a 63 year old female who presents for an annual physical exam.  She experiences knee pain, which is not worsening and possibly improving. She avoids putting pressure on it. An abnormal femur x-ray had initially caused concern, but she was reassured by a specialist. She has purchased new supportive shoes.  She has not received the pneumonia, shingles, or flu vaccines, expressing a general reluctance towards vaccinations, although she did receive some COVID-19 vaccinations.  Her diet and exercise routine have not seen much improvement. She consumes alcohol almost every night, typically two to three drinks, and is considering alternatives like kombucha or tea.  She completed a Cologuard test in July and a bone density test in August, which showed mild bone thinning. She takes a calcium supplement, specifically chewable calcitrate, two a day. Her mammogram was also completed in August.  She has not visited an eye doctor in years. She is up to date with dental visits.  She has a history of vaginal polyps, which were benign and removed. No current cough, cold symptoms, leg swelling, hearing or vision concerns, skin issues, digestive problems, urinary issues, headaches, depression, or anxiety.  Her labs from the summer showed normal kidney, liver, and thyroid  function, with slightly elevated cholesterol. She is currently only taking vitamin D .  Patient presents today for complete physical.  Immunizations: Diet:  needs improvement Exercise: needs improvement Colonoscopy: cologuard- up to date Dexa: osteopenia  Pap Smear: due Mammogram: up to date Vision:  due Dental: up to date      Health Maintenance Due  Topic Date Due   Hepatitis C Screening  Never done   Cervical Cancer Screening (HPV/Pap Cotest)  Never done   Pneumococcal Vaccine: 50+ Years (1 of 1 - PCV) Never done   Zoster Vaccines- Shingrix (1 of 2) Never done   COVID-19 Vaccine (1 - 2025-26 season) Never done    Past Medical History:  Diagnosis Date   Abnormal bone density screening 12/10/2019   History of wrist fracture    Hyperlipidemia    Osteoporosis 12/09/2019   Vitamin D  deficiency 12/04/2019    Past Surgical History:  Procedure Laterality Date   WRIST SURGERY Left 2018   ORIF left for fracture    Family History  Problem Relation Age of Onset   Hypertension Mother    Hypothyroidism Mother    Prostate cancer Father    Osteoporosis Sister    Dementia Maternal Grandfather    Cancer Paternal Grandmother        Lung, non-smoker    Social History   Socioeconomic History   Marital status: Single    Spouse name: Not on file   Number of children: Not on file   Years of education: Not on file   Highest education level: Master's degree (e.g., MA, MS, MEng, MEd, MSW, MBA)  Occupational History   Not on file  Tobacco Use   Smoking status: Never   Smokeless tobacco: Never  Substance and Sexual Activity   Alcohol use: Yes    Alcohol/week: 2.0 standard drinks of alcohol    Types: 2 Shots of liquor per week  Drug use: Never   Sexual activity: Not Currently    Partners: Male  Other Topics Concern   Not on file  Social History Narrative   Worked in airline pilot at [employer]   Husband was in Memory Care died 09/17/2023   Single   Parents I've nearby   Has own [place], dabbles in business    No pets   No children   Enjoys garden, spending time with friends, visit neighbors   Social Drivers of Health   Financial Resource Strain: Patient Declined (09/01/2024)   Overall Financial Resource Strain (CARDIA)    Difficulty of Paying Living Expenses: Patient  declined  Food Insecurity: Patient Declined (09/01/2024)   Hunger Vital Sign    Worried About Running Out of Food in the Last Year: Patient declined    Ran Out of Food in the Last Year: Patient declined  Transportation Needs: No Transportation Needs (09/01/2024)   PRAPARE - Administrator, Civil Service (Medical): No    Lack of Transportation (Non-Medical): No  Physical Activity: Insufficiently Active (09/01/2024)   Exercise Vital Sign    Days of Exercise per Week: 2 days    Minutes of Exercise per Session: 50 min  Stress: No Stress Concern Present (09/01/2024)   [employer] of Occupational Health - Occupational Stress Questionnaire    Feeling of Stress: Not at all  Social Connections: Unknown (09/01/2024)   Social Connection and Isolation Panel    Frequency of Communication with Friends and Family: Patient declined    Frequency of Social Gatherings with Friends and Family: Patient declined    Attends Religious Services: Patient declined    Database Administrator or Organizations: No    Attends Engineer, Structural: Not on file    Marital Status: Patient declined  Catering Manager Violence: Not on file    Outpatient Medications Prior to Visit  Medication Sig Dispense Refill   Cholecalciferol (VITAMIN D3) 75 MCG (3000 UT) TABS Take 1 tablet by mouth daily at 6 (six) AM.     No facility-administered medications prior to visit.    No Known Allergies  Review of Systems  Constitutional:  Negative for weight loss.  HENT:  Negative for congestion and hearing loss.   Eyes:  Negative for blurred vision.  Respiratory:  Negative for cough.   Cardiovascular:  Negative for leg swelling.  Gastrointestinal:  Negative for constipation and diarrhea.  Genitourinary:  Negative for dysuria and frequency.  Musculoskeletal:  Positive for joint pain.  Skin:  Negative for rash.  Neurological:  Negative for headaches.  Psychiatric/Behavioral:  Negative for depression.  The patient is not nervous/anxious.        Objective:    Physical Exam   BP (!) 124/53 (BP Location: Right Arm, Patient Position: Sitting, Cuff Size: Small)   Pulse 71   Temp (!) 97.5 F (36.4 C) (Oral)   Resp 16   Ht 5' 2 (1.575 m)   Wt 166 lb (75.3 kg)   SpO2 98%   BMI 30.36 kg/m  Wt Readings from Last 3 Encounters:  09/02/24 166 lb (75.3 kg)  04/22/24 166 lb (75.3 kg)  12/03/19 169 lb 12.8 oz (77 kg)       Assessment & Plan:   Problem List Items Addressed This Visit       Unprioritized   Osteopenia    Mild bone thinning noted on bone density scan in August. Currently taking calcium supplements. - Continue calcium supplements. - Will repeat  bone density scan in two years.      Encounter for preventative adult health care exam with abnormal findings    Routine visit with no new concerns. Discussed immunizations, lifestyle modifications, and screenings. - Updated Pap smear. - Encouraged scheduling shingles vaccine. - Discussed pneumonia and flu vaccines. - Encouraged healthy diet and exercise. - Encouraged scheduling eye exam. - Discussed decreasing alcohol consumption.       Cervical polyp   New. Refer to GYN.       Relevant Orders   Ambulatory referral to Obstetrics / Gynecology   Other Visit Diagnoses       Encounter for routine gynecological examination with Papanicolaou smear of cervix    -  Primary   Relevant Orders   Cytology - PAP( [facility])      Assessment & Plan      I am having Renee Morton maintain her Vitamin D3.  No orders of the defined types were placed in this encounter.

## 2024-09-02 NOTE — Assessment & Plan Note (Signed)
  Routine visit with no new concerns. Discussed immunizations, lifestyle modifications, and screenings. - Updated Pap smear. - Encouraged scheduling shingles vaccine. - Discussed pneumonia and flu vaccines. - Encouraged healthy diet and exercise. - Encouraged scheduling eye exam. - Discussed decreasing alcohol consumption.

## 2024-09-02 NOTE — Patient Instructions (Addendum)
  VISIT SUMMARY: Today, you came in for your annual physical exam. We discussed your knee pain, vaccination status, diet and exercise habits, alcohol consumption, recent screenings, and overall health. Your lab results from the summer were mostly normal, with slightly elevated cholesterol. You are currently taking vitamin D  and calcium supplements.  YOUR PLAN: -ADULT WELLNESS VISIT: This visit was a routine check-up to discuss your overall health. We talked about the importance of getting your shingles, pneumonia, and flu vaccines. I encouraged you to maintain a healthy diet and exercise regularly. We also discussed the benefits of reducing alcohol consumption to 1 or less drinks/day. Additionally, I recommended scheduling an eye exam and updating your Pap smear.  -CERVICAL POLYP: A cervical polyp is a small growth on the cervix, which is usually benign. I referred you to a gynecologist for further evaluation.  -OSTEOPENIA: Osteopenia means you have mild bone thinning, which can increase the risk of fractures. You should continue taking your calcium supplements, and we will repeat your bone density scan in two years.  -RIGHT KNEE PAIN: Your knee pain is being managed by avoiding activities that worsen it. You should continue with this approach and use supportive shoes.  -HYPERLIPIDEMIA: Hyperlipidemia means you have slightly elevated cholesterol levels. We will continue to monitor your cholesterol.  -VITAMIN D  DEFICIENCY: Vitamin D  deficiency means you have low levels of vitamin D , which is important for bone health. You should continue taking your vitamin D  supplements.  INSTRUCTIONS: Please schedule your shingles vaccine, eye exam, and Pap smear. Continue taking your calcium and vitamin D  supplements. Avoid activities that worsen your knee pain and consider reducing alcohol consumption. We will repeat your bone density scan in two years and continue to monitor your cholesterol  levels.

## 2024-09-02 NOTE — Assessment & Plan Note (Signed)
  Mild bone thinning noted on bone density scan in August. Currently taking calcium supplements. - Continue calcium supplements. - Will repeat bone density scan in two years.

## 2024-09-03 ENCOUNTER — Ambulatory Visit: Payer: Self-pay | Admitting: Family

## 2024-09-03 LAB — CYTOLOGY - PAP
Comment: NEGATIVE
Diagnosis: NEGATIVE
High risk HPV: NEGATIVE
